# Patient Record
Sex: Female | Born: 1965 | Race: Black or African American | Hispanic: No | Marital: Married | Smoking: Never smoker
Health system: Southern US, Community
[De-identification: ages and names within clinical notes are randomized; demographics above are authoritative.]

## PROBLEM LIST (undated history)

## (undated) DIAGNOSIS — N920 Excessive and frequent menstruation with regular cycle: Secondary | ICD-10-CM

## (undated) DIAGNOSIS — Z87898 Personal history of other specified conditions: Secondary | ICD-10-CM

## (undated) DIAGNOSIS — D259 Leiomyoma of uterus, unspecified: Secondary | ICD-10-CM

## (undated) DIAGNOSIS — I341 Nonrheumatic mitral (valve) prolapse: Secondary | ICD-10-CM

## (undated) DIAGNOSIS — D500 Iron deficiency anemia secondary to blood loss (chronic): Secondary | ICD-10-CM

## (undated) HISTORY — DX: Nonrheumatic mitral (valve) prolapse: I34.1

## (undated) HISTORY — PX: WISDOM TOOTH EXTRACTION: SHX21

## (undated) HISTORY — PX: DILATION AND CURETTAGE OF UTERUS: SHX78

---

## 2000-09-30 ENCOUNTER — Other Ambulatory Visit: Admission: RE | Admit: 2000-09-30 | Discharge: 2000-09-30 | Payer: Self-pay | Admitting: Gynecology

## 2000-12-18 ENCOUNTER — Other Ambulatory Visit: Admission: RE | Admit: 2000-12-18 | Discharge: 2000-12-18 | Payer: Self-pay | Admitting: Gynecology

## 2001-06-26 ENCOUNTER — Other Ambulatory Visit: Admission: RE | Admit: 2001-06-26 | Discharge: 2001-06-26 | Payer: Self-pay | Admitting: Gynecology

## 2008-06-09 ENCOUNTER — Encounter: Payer: Self-pay | Admitting: Women's Health

## 2008-06-09 ENCOUNTER — Other Ambulatory Visit: Admission: RE | Admit: 2008-06-09 | Discharge: 2008-06-09 | Payer: Self-pay | Admitting: Gynecology

## 2008-06-09 ENCOUNTER — Ambulatory Visit: Payer: Self-pay | Admitting: Women's Health

## 2008-07-13 ENCOUNTER — Ambulatory Visit: Payer: Self-pay | Admitting: Women's Health

## 2008-11-03 ENCOUNTER — Emergency Department (HOSPITAL_COMMUNITY): Admission: EM | Admit: 2008-11-03 | Discharge: 2008-11-04 | Payer: Self-pay | Admitting: Emergency Medicine

## 2009-12-29 ENCOUNTER — Ambulatory Visit: Payer: Self-pay | Admitting: Women's Health

## 2009-12-29 ENCOUNTER — Other Ambulatory Visit: Admission: RE | Admit: 2009-12-29 | Discharge: 2009-12-29 | Payer: Self-pay | Admitting: Gynecology

## 2010-02-18 ENCOUNTER — Inpatient Hospital Stay (HOSPITAL_COMMUNITY): Admission: AD | Admit: 2010-02-18 | Discharge: 2010-02-18 | Payer: Self-pay | Admitting: Obstetrics and Gynecology

## 2010-02-20 ENCOUNTER — Ambulatory Visit (HOSPITAL_COMMUNITY)
Admission: AD | Admit: 2010-02-20 | Discharge: 2010-02-21 | Payer: Self-pay | Source: Home / Self Care | Admitting: Obstetrics and Gynecology

## 2010-02-20 ENCOUNTER — Encounter (INDEPENDENT_AMBULATORY_CARE_PROVIDER_SITE_OTHER): Payer: Self-pay | Admitting: Obstetrics and Gynecology

## 2010-03-25 DEATH — deceased

## 2010-06-06 LAB — BASIC METABOLIC PANEL
CO2: 26 mEq/L (ref 19–32)
Calcium: 8.5 mg/dL (ref 8.4–10.5)
Creatinine, Ser: 0.85 mg/dL (ref 0.4–1.2)
GFR calc Af Amer: >60 mL/min (ref >60)
Glucose, Bld: 125 mg/dL — ABNORMAL HIGH (ref 70–99)

## 2010-06-06 LAB — CBC
HCT: 27.1 % — ABNORMAL LOW (ref 36.0–46.0)
Hemoglobin: 7.6 g/dL — ABNORMAL LOW (ref 12.0–15.0)
MCH: 21.1 pg — ABNORMAL LOW (ref 26.0–34.0)
MCH: 21.2 pg — ABNORMAL LOW (ref 26.0–34.0)
MCH: 21.8 pg — ABNORMAL LOW (ref 26.0–34.0)
MCHC: 30.7 g/dL (ref 30.0–36.0)
MCHC: 31 g/dL (ref 30.0–36.0)
MCHC: 31.7 g/dL (ref 30.0–36.0)
MCV: 68.5 fL — ABNORMAL LOW (ref 78.0–100.0)
Platelets: 216 10*3/uL (ref 150–400)
RBC: 3.12 MIL/uL — ABNORMAL LOW (ref 3.87–5.11)
RDW: 24.9 % — ABNORMAL HIGH (ref 11.5–15.5)
RDW: 24.9 % — ABNORMAL HIGH (ref 11.5–15.5)
WBC: 13.1 10*3/uL — ABNORMAL HIGH (ref 4.0–10.5)

## 2010-06-30 LAB — URINALYSIS, ROUTINE W REFLEX MICROSCOPIC
Glucose, UA: NEGATIVE mg/dL
Specific Gravity, Urine: 1.023 (ref 1.005–1.030)

## 2010-06-30 LAB — WET PREP, GENITAL: Yeast Wet Prep HPF POC: NONE SEEN

## 2010-06-30 LAB — URINE MICROSCOPIC-ADD ON

## 2011-01-03 ENCOUNTER — Encounter: Payer: Self-pay | Admitting: Anesthesiology

## 2011-01-09 ENCOUNTER — Other Ambulatory Visit (HOSPITAL_COMMUNITY)
Admission: RE | Admit: 2011-01-09 | Discharge: 2011-01-09 | Disposition: A | Discharge status: Home / Self Care | Payer: PRIVATE HEALTH INSURANCE | Source: Ambulatory Visit | Attending: Women's Health | Admitting: Women's Health

## 2011-01-09 ENCOUNTER — Encounter: Payer: Self-pay | Admitting: Women's Health

## 2011-01-09 ENCOUNTER — Ambulatory Visit (INDEPENDENT_AMBULATORY_CARE_PROVIDER_SITE_OTHER): Payer: PRIVATE HEALTH INSURANCE | Admitting: Women's Health

## 2011-01-09 VITALS — BP 124/72 | Ht 67.0 in | Wt 208.0 lb

## 2011-01-09 DIAGNOSIS — R82998 Other abnormal findings in urine: Secondary | ICD-10-CM

## 2011-01-09 DIAGNOSIS — Z01419 Encounter for gynecological examination (general) (routine) without abnormal findings: Secondary | ICD-10-CM | POA: Insufficient documentation

## 2011-01-09 NOTE — Progress Notes (Signed)
Pamela Nguyen 1965-09-08 [id number]    History:    The patient presents for annual exam. 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 Daughters ages 80 and 43.   Past medical history, past surgical history, family history and social history were all reviewed and documented in the EPIC chart.   ROS:  A  ROS was performed and pertinent positives and negatives are included in the history.  Exam:  Filed Vitals:   01/09/11 1434  BP: 46/72    General appearance:  Normal Head/Neck:  Normal, without cervical or supraclavicular adenopathy. Thyroid:  Symmetrical, normal in size, without palpable masses or nodularity. Respiratory  Effort:  Normal  Auscultation:  Clear without wheezing or rhonchi Cardiovascular  Auscultation:  Regular rate, without rubs, murmurs or gallops  Edema/varicosities:  Not grossly evident Abdominal  Soft,nontender, without masses, guarding or rebound.  Liver/spleen:  No organomegaly noted  Hernia:  None appreciated  Skin  Inspection:  Grossly normal  Palpation:  Grossly normal Neurologic/psychiatric  Orientation:  Normal with appropriate conversation.  Mood/affect:  Normal  Genitourinary    Breasts: Examined lying and sitting.     Right: Without masses, retractions, discharge or axillary adenopathy.     Left: Without masses, retractions, discharge or axillary adenopathy.   Inguinal/mons:  Normal without inguinal adenopathy  External genitalia:  Normal  BUS/Urethra/Skene's glands:  Normal  Bladder:  Normal  Vagina:  Normal  Cervix:  Normal  Uterus:   normal in size, shape and contour.  Midline and mobile  Adnexa/parametria:     Rt: Without masses or tenderness.   Lt: Without masses or tenderness.  Anus and perineum: Normal  Digital rectal exam: Normal sphincter tone without palpated masses or tenderness  Assessment/Plan:  45 y.o.MBF G3P2  for annual exam monthly 7 day cycle using no contraception. Contraception options were  reviewed and declined states that if pregnancy occurs it will be fine. Is currently taking a prenatal vitamin daily. She has never had a mammogram, strongly encouraged to schedule. History of iron deficiency anemia.  Normal GYN exam with anemia  Plan: Did review fertility,  increased risk for chromosomal problems and decreased fertility as we age. Will continue with her prenatal vitamin daily, increase iron rich foods. Reviewed lysteda  with cycles. Sample, information was given and reviewed slight risks for blood clots, will contemplate using, states she doesn't like to use medications. SBEs, breast center number was given and strongly encouraged a screening mammogram. Encouraged exercise, decreasing calories for weight loss. CBC, UA and Pap. Had a normal lipid screen last year.   Harrington Challenger [facility], 3:28 PM 01/09/2011

## 2011-01-14 ENCOUNTER — Telehealth: Payer: Self-pay | Admitting: Women's Health

## 2011-01-14 NOTE — Telephone Encounter (Signed)
Telephone call, reviewed hemoglobin/ hematocrit are low 8.1 and 26.3. Has had a hemoglobin that was 6.8 about a year ago, had been on iron supplements but then did stop. Will start back on an iron supplement twice daily, continue a multivitamin daily. Is aware of iron rich foods and will try to increase daily. Will recheck a CBC in December. If it is not elevated we'll then do iron studies. Denies sickle cell trait. Works at [employer], will have a CBC checked they are and fax to our office.

## 2011-08-26 ENCOUNTER — Other Ambulatory Visit: Payer: Self-pay | Admitting: Obstetrics and Gynecology

## 2011-08-26 DIAGNOSIS — R635 Abnormal weight gain: Secondary | ICD-10-CM

## 2011-08-26 DIAGNOSIS — N920 Excessive and frequent menstruation with regular cycle: Secondary | ICD-10-CM | POA: Insufficient documentation

## 2011-08-26 DIAGNOSIS — D649 Anemia, unspecified: Secondary | ICD-10-CM | POA: Insufficient documentation

## 2011-08-26 DIAGNOSIS — L659 Nonscarring hair loss, unspecified: Secondary | ICD-10-CM

## 2011-08-26 NOTE — Progress Notes (Signed)
Patient with menorrhagia (5 day flow, 3 days changes tampon and pad hourly-no cramps) also complains of thinning hair and problems losing weight for the past 2 months.  Requesting labs for evaluation. CBC, Thyroid Panel & Ferritin are pending.  Loryn Haacke, PA-C

## 2011-08-27 ENCOUNTER — Other Ambulatory Visit: Payer: PRIVATE HEALTH INSURANCE

## 2011-08-27 DIAGNOSIS — N920 Excessive and frequent menstruation with regular cycle: Secondary | ICD-10-CM

## 2011-08-27 DIAGNOSIS — L659 Nonscarring hair loss, unspecified: Secondary | ICD-10-CM

## 2011-08-27 DIAGNOSIS — D649 Anemia, unspecified: Secondary | ICD-10-CM

## 2011-08-27 DIAGNOSIS — R635 Abnormal weight gain: Secondary | ICD-10-CM

## 2011-08-27 LAB — TSH: TSH: 1.923 u[IU]/mL (ref 0.350–4.500)

## 2011-08-27 LAB — CBC
MCH: 22.1 pg — ABNORMAL LOW (ref 26.0–34.0)
MCHC: 30.7 g/dL (ref 30.0–36.0)
Platelets: 344 10*3/uL (ref 150–400)

## 2011-08-30 ENCOUNTER — Telehealth: Payer: Self-pay | Admitting: Obstetrics and Gynecology

## 2011-08-30 NOTE — Telephone Encounter (Signed)
46 YO with  history of anemia had labs done recently due to increased fatigue and thinning hair.  Notified of normal thyroid test but low hemoglobin and though low normal ferritin, it was too low to support her hair growth.  Advised OTC iron twice daily x 6 weeks with a repeat cbc at that time.  Patient was agreeable.  Briarrose Shor, PA-C

## 2011-09-24 ENCOUNTER — Other Ambulatory Visit: Payer: Self-pay | Admitting: Obstetrics and Gynecology

## 2011-09-24 ENCOUNTER — Other Ambulatory Visit: Payer: Self-pay | Admitting: Women's Health

## 2011-09-24 DIAGNOSIS — Z1231 Encounter for screening mammogram for malignant neoplasm of breast: Secondary | ICD-10-CM

## 2011-09-27 ENCOUNTER — Ambulatory Visit
Admission: RE | Admit: 2011-09-27 | Discharge: 2011-09-27 | Disposition: A | Discharge status: Home / Self Care | Payer: PRIVATE HEALTH INSURANCE | Source: Ambulatory Visit | Attending: Obstetrics and Gynecology | Admitting: Obstetrics and Gynecology

## 2011-09-27 DIAGNOSIS — Z1231 Encounter for screening mammogram for malignant neoplasm of breast: Secondary | ICD-10-CM

## 2014-01-24 ENCOUNTER — Encounter: Payer: Self-pay | Admitting: Women's Health

## 2016-12-27 ENCOUNTER — Encounter (HOSPITAL_COMMUNITY): Payer: Self-pay | Admitting: Emergency Medicine

## 2016-12-27 DIAGNOSIS — D649 Anemia, unspecified: Secondary | ICD-10-CM | POA: Diagnosis not present

## 2016-12-27 DIAGNOSIS — I10 Essential (primary) hypertension: Secondary | ICD-10-CM | POA: Insufficient documentation

## 2016-12-27 DIAGNOSIS — Z5329 Procedure and treatment not carried out because of patient's decision for other reasons: Secondary | ICD-10-CM | POA: Insufficient documentation

## 2016-12-27 DIAGNOSIS — R55 Syncope and collapse: Secondary | ICD-10-CM | POA: Diagnosis not present

## 2016-12-27 LAB — I-STAT BETA HCG BLOOD, ED (MC, WL, AP ONLY)

## 2016-12-27 MED ORDER — ONDANSETRON 4 MG PO TBDP: 4.0000 mg | ORAL_TABLET | Freq: Once | ORAL | Status: DC | PRN

## 2016-12-27 NOTE — ED Triage Notes (Signed)
Patient was standing at a football game around 8 pm. Patient husband states he saw his wife sweating and then she passed out. Patient husband states the was caught before she hit the ground. Patient states she has had several times where she feels like she is going to pass out but usually sit down and rest. She states it has not never been this bad. Patient does not remember passing out or sweating. She states she felt weak and dizzy.

## 2016-12-28 ENCOUNTER — Emergency Department (HOSPITAL_COMMUNITY)
Admission: EM | Admit: 2016-12-28 | Discharge: 2016-12-28 | Disposition: A | Discharge status: Home / Self Care | Payer: 59 | Attending: Emergency Medicine | Admitting: Emergency Medicine

## 2016-12-28 DIAGNOSIS — Z87898 Personal history of other specified conditions: Secondary | ICD-10-CM

## 2016-12-28 DIAGNOSIS — D649 Anemia, unspecified: Secondary | ICD-10-CM

## 2016-12-28 DIAGNOSIS — R55 Syncope and collapse: Secondary | ICD-10-CM

## 2016-12-28 HISTORY — DX: Personal history of other specified conditions: Z87.898

## 2016-12-28 LAB — CBC
HCT: 16.5 % — ABNORMAL LOW (ref 36.0–46.0)
HEMOGLOBIN: 4 g/dL — AB (ref 12.0–15.0)
MCH: 13.8 pg — ABNORMAL LOW (ref 26.0–34.0)
MCHC: 24.2 g/dL — AB (ref 30.0–36.0)
MCV: 57.1 fL — ABNORMAL LOW (ref 78.0–100.0)
Platelets: 371 10*3/uL (ref 150–400)
RBC: 2.89 MIL/uL — ABNORMAL LOW (ref 3.87–5.11)
RDW: 19.7 % — ABNORMAL HIGH (ref 11.5–15.5)
WBC: 9.8 10*3/uL (ref 4.0–10.5)

## 2016-12-28 LAB — ABO/RH: ABO/RH(D): A POS

## 2016-12-28 LAB — BASIC METABOLIC PANEL
ANION GAP: 9 (ref 5–15)
BUN: 10 mg/dL (ref 6–20)
CALCIUM: 9.2 mg/dL (ref 8.9–10.3)
CO2: 25 mmol/L (ref 22–32)
Chloride: 102 mmol/L (ref 101–111)
Creatinine, Ser: 0.76 mg/dL (ref 0.44–1.00)
GFR calc Af Amer: >60 mL/min (ref >60)
GLUCOSE: 138 mg/dL — AB (ref 65–99)
Potassium: 4.1 mmol/L (ref 3.5–5.1)
SODIUM: 136 mmol/L (ref 135–145)

## 2016-12-28 LAB — LIPASE, BLOOD: Lipase: 21 U/L (ref 11–51)

## 2016-12-28 LAB — CBG MONITORING, ED: GLUCOSE-CAPILLARY: 153 mg/dL — AB (ref 65–99)

## 2016-12-28 LAB — PREPARE RBC (CROSSMATCH)

## 2016-12-28 MED ORDER — SODIUM CHLORIDE 0.9 % IV SOLN: Freq: Once | INTRAVENOUS | Status: DC

## 2016-12-28 NOTE — ED Provider Notes (Signed)
[facility] DEPT Provider Note   CSN: [id number] Arrival date & time: 12/27/16  2310     History   Chief Complaint Chief Complaint  Patient presents with  . Loss of Consciousness    HPI Pamela Nguyen is a 51 y.o. female with history of MVP, HTN, menorrhagia, and chronic anemia who presents today with chief complaint acute onset, resolved syncopal episode. Patient states that she was at a football game when at around 8 PM she felt very lightheaded and experienced a pressure sensation in her head. She states this is how she usually feels when she is about to pass out, so she attempted to look for a place to sit, but states "and then everything went black before I could find somewhere to sit ". Patient's husband and a bystander were able to catch her before she fell and lowered her to the ground. Patient's husband states that she was unresponsive for ~ 3 minutes. Patient states that this feels like the last time that she was severely anemic and required blood transfusion 6 years ago after a miscarriage. She states that she is currently on her menstrual cycle, which she has been on for 7 days. She states that lately her cycles have been lasting upwards of 2 weeks but have recently been normalizing. She states that she has always had very heavy periods. She states that for the past week she has been experiencing lightheadedness especially with position changes, increasing shortness of breath with activity, and fatigue "feeling like my legs will give out after walking for a while". States that she has not had her anemia evaluated in several years as she has been without a PCP for approximately 3 years. States that her hemoglobin typically runs around 9.  The history is provided by the spouse and the patient.    Past Medical History:  Diagnosis Date  . Hypertension   . MVP (mitral valve prolapse)     Patient Active Problem List   Diagnosis Date Noted  . Weight gain 08/26/2011  .  Hair loss 08/26/2011  . Menorrhagia 08/26/2011  . Anemia 08/26/2011    Past Surgical History:  Procedure Laterality Date  . CESAREAN SECTION  04/01/1986   girl  . DILATION AND CURETTAGE OF UTERUS  nov. 2011    OB History    Gravida Para Term Preterm AB Living   3 2     1 2    SAB TAB Ectopic Multiple Live Births   1               Home Medications    Prior to Admission medications   Not on File    Family History Family History  Problem Relation Age of Onset  . Hypertension Mother   . Breast cancer Maternal Aunt   . Diabetes Maternal Grandmother   . Hypertension Maternal Grandmother   . Diabetes Maternal Grandfather   . Hypertension Maternal Grandfather   . Heart disease Maternal Grandfather     Social History Social History  Substance Use Topics  . Smoking status: Never Smoker  . Smokeless tobacco: Never Used  . Alcohol use Yes     Comment: social     Allergies   Patient has no known allergies.   Review of Systems Review of Systems  Constitutional: Positive for fatigue. Negative for chills and fever.  Respiratory: Positive for shortness of breath.   Cardiovascular: Negative for chest pain.  Gastrointestinal: Negative for abdominal pain, nausea and vomiting.  Genitourinary: Positive  for menstrual problem.  Musculoskeletal: Negative for back pain and neck pain.  Neurological: Positive for syncope and light-headedness. Negative for numbness and headaches.  All other systems reviewed and are negative.    Physical Exam Updated Vital Signs BP (!) 143/56 (BP Location: Left Arm)   Pulse 78   Temp 98.1 F (36.7 C) (Oral)   Resp 20   Ht 5\' 7"  (1.702 m)   Wt 86.2 kg (190 lb)   SpO2 100%   BMI 29.76 kg/m   Physical Exam  Constitutional: She is oriented to person, place, and time. She appears well-developed and well-nourished. No distress.  HENT:  Head: Normocephalic and atraumatic.  Right Ear: External ear normal.  Left Ear: External ear normal.    No Battle's signs, no raccoon's eyes, no rhinorrhea. No hemotympanum. No tenderness to palpation of the face or skull. No deformity, crepitus, or swelling noted.   Eyes: Pupils are equal, round, and reactive to light. Conjunctivae and EOM are normal. Right eye exhibits no discharge. Left eye exhibits no discharge.  Palpebral conjunctiva are pale  Neck: Normal range of motion. Neck supple. No JVD present. No tracheal deviation present.  Cardiovascular:  Tachycardic, 2+ radial and DP/PT pulses bl, negative Homan's bl   Pulmonary/Chest: Effort normal and breath sounds normal. She exhibits no tenderness.  Abdominal: Soft. She exhibits no distension. There is no tenderness.  Musculoskeletal: Normal range of motion. She exhibits no edema or tenderness.  No midline spine TTP, no paraspinal muscle tenderness, no deformity, crepitus, or step-off noted   Neurological: She is alert and oriented to person, place, and time. No cranial nerve deficit or sensory deficit. She exhibits normal muscle tone.  Skin: Skin is warm and dry. Capillary refill takes less than 2 seconds. No erythema. There is pallor.  Psychiatric: She has a normal mood and affect. Her behavior is normal.  Nursing note and vitals reviewed.    ED Treatments / Results  Labs (all labs ordered are listed, but only abnormal results are displayed) Labs Reviewed  BASIC METABOLIC PANEL - Abnormal; Notable for the following:       Result Value   Glucose, Bld 138 (*)    All other components within normal limits  CBC - Abnormal; Notable for the following:    RBC 2.89 (*)    Hemoglobin 4.0 (*)    HCT 16.5 (*)    MCV 57.1 (*)    MCH 13.8 (*)    MCHC 24.2 (*)    RDW 19.7 (*)    All other components within normal limits  CBG MONITORING, ED - Abnormal; Notable for the following:    Glucose-Capillary 153 (*)    All other components within normal limits  LIPASE, BLOOD  URINALYSIS, ROUTINE W REFLEX MICROSCOPIC  I-STAT BETA HCG BLOOD, ED  (MC, [facility], AP ONLY)  TYPE AND SCREEN  PREPARE RBC (CROSSMATCH)  ABO/RH    EKG  EKG Interpretation  Date/Time:  Friday December 27 2016 23:33:21 EDT Ventricular Rate:  78 PR Interval:    QRS Duration: 88 QT Interval:  397 QTC Calculation: 453 R Axis:   71 Text Interpretation:  Sinus rhythm Consider left ventricular hypertrophy Borderline T abnormalities, anterior leads No old tracing to compare Confirmed by Sherwood Gambler [phone]) on 12/27/2016 11:39:10 PM       Radiology No results found.  Procedures Procedures (including critical care time)  Medications Ordered in ED Medications  ondansetron (ZOFRAN-ODT) disintegrating tablet 4 mg (not administered)  0.9 %  sodium chloride infusion (not administered)     Initial Impression / Assessment and Plan / ED Course  I have reviewed the triage vital signs and the nursing notes.  Pertinent labs & imaging results that were available during my care of the patient were reviewed by me and considered in my medical decision making (see chart for details).     Patient with history of anemia presents after syncopal episode. Found to have hemoglobin of 4. Afebrile, tachycardic on my examination but otherwise vital signs are stable. No focal neurological deficits on examination and she denies headache or head injury. I doubt ICH, skull fracture, or other intracranial abnormality. Obtained type and screen and will plan to transfuse. Spoke with Dr. Alcario Drought with [facility] who agrees to assume care of pt and bring her into the hospital for further management. patient seen and evaluated by Dr. Verner Chol who agrees with assessment and plan at this time.   2:54 AM Patient wants to leave against medical advice. Patient understands that her actions will lead to inadequate medical workup, and that she is at risk of complications of missed diagnosis, which includes morbidity and mortality. Dr. Regenia Skeeter and Dr. Alcario Drought alternative options and patient was given  the opportunity to change her mind.discussed. Patient is demonstrating good capacity to make decision. Patient understands that she needs to return to the ED immediately if any concerning signs or symptoms develop or worsen.   Final diagnoses:  Symptomatic anemia  Syncope and collapse    New Prescriptions New Prescriptions   No medications on file     Debroah Baller 12/28/16 [place], [place], MD 12/28/16 [phone]

## 2016-12-28 NOTE — ED Notes (Addendum)
Date and time results received: 12/28/16  0101 Test: Hemoglobin Critical Value: 4.0  Name of Provider Notified: Regenia Skeeter  Orders Received? Or Actions Taken?: MD will follow up with patient regarding plan of care

## 2016-12-28 NOTE — Discharge Instructions (Signed)
Follow-up with a primary care physician for reevaluation of your anemia as soon as possible. Return to the ED immediately if any concerning signs or symptoms develop such as repeat loss of consciousness, blood in your urine or stool, or worsening bleeding.

## 2016-12-28 NOTE — ED Notes (Signed)
Pt aware of low hgb and suggestions of blood transfusion made by MD. Pt refusing to get blood transfusion and prefers to go home. Pt states she has chronic anemia along with family history of such. Pt leaving AMA.

## 2017-01-01 LAB — TYPE AND SCREEN
ABO/RH(D): A POS
Antibody Screen: NEGATIVE
UNIT DIVISION: 0
Unit division: 0

## 2017-01-01 LAB — BPAM RBC
BLOOD PRODUCT EXPIRATION DATE: 201810182359
BLOOD PRODUCT EXPIRATION DATE: 201810182359
Unit Type and Rh: 6200
Unit Type and Rh: 6200

## 2017-04-15 ENCOUNTER — Encounter (HOSPITAL_COMMUNITY): Payer: Self-pay | Admitting: Obstetrics and Gynecology

## 2017-05-28 ENCOUNTER — Encounter (HOSPITAL_COMMUNITY): Payer: Self-pay | Admitting: *Deleted

## 2017-05-29 ENCOUNTER — Other Ambulatory Visit: Payer: Self-pay | Admitting: Obstetrics and Gynecology

## 2017-06-18 ENCOUNTER — Inpatient Hospital Stay (HOSPITAL_COMMUNITY): Admission: RE | Admit: 2017-06-18 | Payer: PRIVATE HEALTH INSURANCE | Source: Ambulatory Visit

## 2017-07-03 ENCOUNTER — Encounter (HOSPITAL_BASED_OUTPATIENT_CLINIC_OR_DEPARTMENT_OTHER): Payer: Self-pay | Admitting: *Deleted

## 2017-07-07 ENCOUNTER — Encounter (HOSPITAL_BASED_OUTPATIENT_CLINIC_OR_DEPARTMENT_OTHER): Payer: Self-pay | Admitting: *Deleted

## 2017-07-07 ENCOUNTER — Other Ambulatory Visit: Payer: Self-pay

## 2017-07-07 NOTE — Progress Notes (Signed)
SPOKE W/ PT VIA PHONE FOR PRE-OP INTERVIEW.  NPO AFTER MN.  ARRIVE AT 0630.  GETTING CBC, BMET, T&S DONE Thursday [id number] @1300 .

## 2017-07-10 ENCOUNTER — Encounter (HOSPITAL_COMMUNITY)
Admission: RE | Admit: 2017-07-10 | Discharge: 2017-07-10 | Disposition: A | Discharge status: Home / Self Care | Payer: 59 | Source: Ambulatory Visit | Attending: Obstetrics and Gynecology | Admitting: Obstetrics and Gynecology

## 2017-07-10 DIAGNOSIS — Z01812 Encounter for preprocedural laboratory examination: Secondary | ICD-10-CM | POA: Insufficient documentation

## 2017-07-10 LAB — BASIC METABOLIC PANEL
Anion gap: 11 (ref 5–15)
BUN: 13 mg/dL (ref 6–20)
CALCIUM: 9.6 mg/dL (ref 8.9–10.3)
CHLORIDE: 108 mmol/L (ref 101–111)
CO2: 27 mmol/L (ref 22–32)
CREATININE: 0.79 mg/dL (ref 0.44–1.00)
GFR calc non Af Amer: >60 mL/min (ref >60)
Glucose, Bld: 101 mg/dL — ABNORMAL HIGH (ref 65–99)
Potassium: 5 mmol/L (ref 3.5–5.1)
SODIUM: 146 mmol/L — AB (ref 135–145)

## 2017-07-10 LAB — CBC
HCT: 34.7 % — ABNORMAL LOW (ref 36.0–46.0)
Hemoglobin: 10.3 g/dL — ABNORMAL LOW (ref 12.0–15.0)
MCH: 24.3 pg — ABNORMAL LOW (ref 26.0–34.0)
MCHC: 29.7 g/dL — ABNORMAL LOW (ref 30.0–36.0)
MCV: 81.8 fL (ref 78.0–100.0)
PLATELETS: 330 10*3/uL (ref 150–400)
RBC: 4.24 MIL/uL (ref 3.87–5.11)
RDW: 14.5 % (ref 11.5–15.5)
WBC: 6.9 10*3/uL (ref 4.0–10.5)

## 2017-07-15 NOTE — H&P (Signed)
Admission History and Physical Exam for a Gynecology Patient  [name] is a 52 y.o. female, [id number], who presents for hysteroscopy, D and C, and resection of a submucosal fibroid. She has been followed at the [facility]cology division of [facility]. She C/O menorrhagia. She has anemia. An US showed a multifibroid uterus with a submucosal fibroid measuring 3.31 cm. She does not want hysterectomy or ablation.  OB History    Gravida  3   Para  2   Term      Preterm      AB  1   Living  2     SAB  1   TAB      Ectopic      Multiple      Live Births              Past Medical History:  Diagnosis Date  . Chronic blood loss anemia    in epic ED visit 12-28-2016 syncope w/ collapse, Hg 4.0 , refused blood products and left AMA  . History of syncope 12/28/2016   w/ collapse--- ED visit dx severe anemia Hg 4.0,  pt refused blood transfusion and left AMA;  in epic documented prior Hg 6.8 after miscarriage 02-20-2010  . Menorrhagia   . MVP (mitral valve prolapse)    07-07-2017  per pt dx w/ MVP at age 52, had echo was told very mild, pt asymptomatic--- no further has been done  . Uterine fibroid     Meds: Vit D, Vit C, and Iron  Past Surgical History:  Procedure Laterality Date  . CESAREAN SECTION  04/01/1986  . DILATION AND CURETTAGE OF UTERUS  02-20-2010  dr Raphael Gibney   w/ sunction  for missed ab  . WISDOM TOOTH EXTRACTION      Allergies  Allergen Reactions  . Codeine     "cough medication with codeine, causes me cough to worsen"    Family History: family history includes Breast cancer in her maternal aunt; Diabetes in her maternal grandfather and maternal grandmother; Heart disease in her maternal grandfather; Hypertension in her maternal grandfather, maternal grandmother, and mother.  Social History:  reports that she has never smoked. She has never used smokeless tobacco. She reports that she drank  alcohol. She reports that she does not use drugs.  Review of systems: See HPI.  Admission Physical Exam:    Body mass index is 29.76 kg/m.  Height 5\' 7"  (1.702 m), weight 86.2 kg (190 lb), last menstrual period 06/06/2017.  HEENT:                 Within normal limits Chest:                   Clear Heart:                    Regular rate and rhythm Breasts:                No masses, skin changes, bleeding, or discharge present Abdomen:             Nontender, no masses Extremities:          Grossly normal Neurologic exam: Grossly normal  Pelvic exam:  External genitalia: normal general appearance Vaginal: normal without tenderness, induration or masses Cervix: normal appearance Adnexa: normal bimanual exam Uterus: 12 weeks size with multiple fibroids.  Assessment:  Menorrhagia  Fibroid Uterus  Anemia  BMI  is 30.5  Plan:  Hysteroscopy, D and C, Resection of submucosal fibroid.   Eli Hose 07/15/2017

## 2017-07-16 ENCOUNTER — Ambulatory Visit (HOSPITAL_BASED_OUTPATIENT_CLINIC_OR_DEPARTMENT_OTHER): Payer: 59 | Admitting: Anesthesiology

## 2017-07-16 ENCOUNTER — Other Ambulatory Visit: Payer: Self-pay

## 2017-07-16 ENCOUNTER — Encounter (HOSPITAL_BASED_OUTPATIENT_CLINIC_OR_DEPARTMENT_OTHER): Payer: Self-pay | Admitting: Anesthesiology

## 2017-07-16 ENCOUNTER — Encounter (HOSPITAL_BASED_OUTPATIENT_CLINIC_OR_DEPARTMENT_OTHER)
Admission: RE | Disposition: A | Discharge status: Home / Self Care | Payer: Self-pay | Source: Ambulatory Visit | Attending: Obstetrics and Gynecology

## 2017-07-16 ENCOUNTER — Ambulatory Visit (HOSPITAL_BASED_OUTPATIENT_CLINIC_OR_DEPARTMENT_OTHER)
Admission: RE | Admit: 2017-07-16 | Discharge: 2017-07-16 | Disposition: A | Discharge status: Home / Self Care | Payer: 59 | Source: Ambulatory Visit | Attending: Obstetrics and Gynecology | Admitting: Obstetrics and Gynecology

## 2017-07-16 DIAGNOSIS — D649 Anemia, unspecified: Secondary | ICD-10-CM | POA: Diagnosis not present

## 2017-07-16 DIAGNOSIS — D250 Submucous leiomyoma of uterus: Secondary | ICD-10-CM | POA: Diagnosis present

## 2017-07-16 DIAGNOSIS — N920 Excessive and frequent menstruation with regular cycle: Secondary | ICD-10-CM | POA: Insufficient documentation

## 2017-07-16 HISTORY — DX: Iron deficiency anemia secondary to blood loss (chronic): D50.0

## 2017-07-16 HISTORY — DX: Leiomyoma of uterus, unspecified: D25.9

## 2017-07-16 HISTORY — PX: DILATATION & CURRETTAGE/HYSTEROSCOPY WITH RESECTOCOPE: SHX5572

## 2017-07-16 HISTORY — DX: Excessive and frequent menstruation with regular cycle: N92.0

## 2017-07-16 HISTORY — DX: Personal history of other specified conditions: Z87.898

## 2017-07-16 LAB — TYPE AND SCREEN
ABO/RH(D): A POS
Antibody Screen: NEGATIVE

## 2017-07-16 LAB — POCT PREGNANCY, URINE: Preg Test, Ur: NEGATIVE

## 2017-07-16 SURGERY — DILATATION & CURETTAGE/HYSTEROSCOPY WITH RESECTOCOPE
Anesthesia: General | Site: Uterus

## 2017-07-16 MED ORDER — FENTANYL CITRATE (PF) 100 MCG/2ML IJ SOLN
INTRAMUSCULAR | Status: AC
  Filled 2017-07-16: qty 2

## 2017-07-16 MED ORDER — DEXAMETHASONE SODIUM PHOSPHATE 10 MG/ML IJ SOLN
INTRAMUSCULAR | Status: AC
  Filled 2017-07-16: qty 1

## 2017-07-16 MED ORDER — FENTANYL CITRATE (PF) 100 MCG/2ML IJ SOLN
INTRAMUSCULAR | Status: DC | PRN
  Administered 2017-07-16 (×2): 25 ug via INTRAVENOUS
  Administered 2017-07-16: 100 ug via INTRAVENOUS
  Administered 2017-07-16: 50 ug via INTRAVENOUS

## 2017-07-16 MED ORDER — PROPOFOL 10 MG/ML IV BOLUS
INTRAVENOUS | Status: AC
  Filled 2017-07-16: qty 40

## 2017-07-16 MED ORDER — PROMETHAZINE HCL 25 MG/ML IJ SOLN
6.2500 mg | INTRAMUSCULAR | Status: DC | PRN
  Filled 2017-07-16: qty 1

## 2017-07-16 MED ORDER — BUPIVACAINE-EPINEPHRINE 0.5% -1:200000 IJ SOLN
INTRAMUSCULAR | Status: DC | PRN
  Administered 2017-07-16: 10 mL

## 2017-07-16 MED ORDER — ONDANSETRON HCL 4 MG/2ML IJ SOLN
INTRAMUSCULAR | Status: AC
  Filled 2017-07-16: qty 2

## 2017-07-16 MED ORDER — PROPOFOL 10 MG/ML IV BOLUS
INTRAVENOUS | Status: DC | PRN
  Administered 2017-07-16: 200 mg via INTRAVENOUS

## 2017-07-16 MED ORDER — MIDAZOLAM HCL 2 MG/2ML IJ SOLN
INTRAMUSCULAR | Status: AC
  Filled 2017-07-16: qty 2

## 2017-07-16 MED ORDER — KETOROLAC TROMETHAMINE 30 MG/ML IJ SOLN
INTRAMUSCULAR | Status: DC | PRN
  Administered 2017-07-16: 30 mg via INTRAVENOUS

## 2017-07-16 MED ORDER — SODIUM CHLORIDE 0.9 % IR SOLN
Status: DC | PRN
  Administered 2017-07-16: 3000 mL

## 2017-07-16 MED ORDER — ONDANSETRON HCL 4 MG/2ML IJ SOLN
4.0000 mg | Freq: Once | INTRAMUSCULAR | Status: AC
  Administered 2017-07-16: 4 mg via INTRAVENOUS
  Filled 2017-07-16: qty 2

## 2017-07-16 MED ORDER — LACTATED RINGERS IV SOLN
INTRAVENOUS | Status: DC
  Administered 2017-07-16 (×2): via INTRAVENOUS
  Filled 2017-07-16: qty 1000

## 2017-07-16 MED ORDER — ACETAMINOPHEN 10 MG/ML IV SOLN
1000.0000 mg | Freq: Once | INTRAVENOUS | Status: DC | PRN
  Filled 2017-07-16: qty 100

## 2017-07-16 MED ORDER — ONDANSETRON HCL 4 MG/2ML IJ SOLN
INTRAMUSCULAR | Status: DC | PRN
  Administered 2017-07-16: 4 mg via INTRAVENOUS

## 2017-07-16 MED ORDER — LIDOCAINE 2% (20 MG/ML) 5 ML SYRINGE
INTRAMUSCULAR | Status: AC
  Filled 2017-07-16: qty 5

## 2017-07-16 MED ORDER — FENTANYL CITRATE (PF) 100 MCG/2ML IJ SOLN
25.0000 ug | INTRAMUSCULAR | Status: DC | PRN
  Filled 2017-07-16: qty 1

## 2017-07-16 MED ORDER — OXYCODONE-ACETAMINOPHEN 5-325 MG PO TABS
1.0000 | ORAL_TABLET | ORAL | 0 refills | Status: AC | PRN
Start: 2017-07-16 — End: ?

## 2017-07-16 MED ORDER — MIDAZOLAM HCL 5 MG/5ML IJ SOLN
INTRAMUSCULAR | Status: DC | PRN
  Administered 2017-07-16: 2 mg via INTRAVENOUS

## 2017-07-16 MED ORDER — IBUPROFEN 800 MG PO TABS: 800.0000 mg | ORAL_TABLET | Freq: Three times a day (TID) | ORAL | 1 refills | Status: AC | PRN

## 2017-07-16 MED ORDER — DEXAMETHASONE SODIUM PHOSPHATE 4 MG/ML IJ SOLN
INTRAMUSCULAR | Status: DC | PRN
  Administered 2017-07-16: 10 mg via INTRAVENOUS

## 2017-07-16 SURGICAL SUPPLY — 19 items
BIPOLAR CUTTING LOOP 21FR (ELECTRODE) ×2
CANISTER SUCT 3000ML PPV (MISCELLANEOUS) ×5 IMPLANT
CATH ROBINSON RED A/P 16FR (CATHETERS) ×3 IMPLANT
DEVICE MYOSURE REACH (MISCELLANEOUS) ×2 IMPLANT
DILATOR CANAL MILEX (MISCELLANEOUS) IMPLANT
ELECT REM PT RETURN 9FT ADLT (ELECTROSURGICAL) ×3
ELECTRODE REM PT RTRN 9FT ADLT (ELECTROSURGICAL) IMPLANT
GLOVE BIOGEL PI IND STRL 8.5 (GLOVE) ×1 IMPLANT
GLOVE BIOGEL PI INDICATOR 8.5 (GLOVE) ×2
GLOVE ECLIPSE 8.0 STRL XLNG CF (GLOVE) ×6 IMPLANT
GOWN STRL REUS W/ TWL XL LVL3 (GOWN DISPOSABLE) ×2 IMPLANT
GOWN STRL REUS W/TWL XL LVL3 (GOWN DISPOSABLE) ×6
KIT TURNOVER CYSTO (KITS) ×3 IMPLANT
LOOP CUTTING BIPOLAR 21FR (ELECTRODE) IMPLANT
MANIFOLD NEPTUNE II (INSTRUMENTS) IMPLANT
PACK ABDOMINAL GYN (CUSTOM PROCEDURE TRAY) ×3 IMPLANT
PAD OB MATERNITY 4.3X12.25 (PERSONAL CARE ITEMS) ×3 IMPLANT
TOWEL OR 17X24 6PK STRL BLUE (TOWEL DISPOSABLE) ×6 IMPLANT
WATER STERILE IRR 500ML POUR (IV SOLUTION) ×3 IMPLANT

## 2017-07-16 NOTE — Anesthesia Procedure Notes (Signed)
Procedure Name: LMA Insertion Date/Time: 07/16/2017 8:42 AM Performed by: Maryella Shivers, CRNA Pre-anesthesia Checklist: Patient identified, Emergency Drugs available, Suction available and Patient being monitored Patient Re-evaluated:Patient Re-evaluated prior to induction Oxygen Delivery Method: Circle system utilized Preoxygenation: Pre-oxygenation with 100% oxygen Induction Type: IV induction Ventilation: Mask ventilation without difficulty LMA: LMA inserted LMA Size: 4.0 Number of attempts: 1 Airway Equipment and Method: Bite block Placement Confirmation: positive ETCO2 Tube secured with: Tape Dental Injury: Teeth and Oropharynx as per pre-operative assessment

## 2017-07-16 NOTE — Progress Notes (Signed)
The patient was interviewed and examined today.  The previously documented history and physical examination was reviewed. There are no changes. The operative procedure was reviewed. The risks and benefits were outlined again. The specific risks include, but are not limited to, anesthetic complications, bleeding, infections, and possible damage to the surrounding organs. The patient's questions were answered.  We are ready to proceed as outlined. The likelihood of the patient achieving the goals of this procedure is very likely.   BP (!) 201/98   Pulse 73   Temp 98.8 F (37.1 C) (Oral)   Resp 16   Ht 5\' 7"  (1.702 m)   Wt 90 kg (198 lb 8 oz)   LMP 06/06/2017   SpO2 100%   BMI 31.09 kg/m  CBC    Component Value Date/Time   WBC 6.9 07/10/2017 1338   RBC 4.24 07/10/2017 1338   HGB 10.3 (L) 07/10/2017 1338   HCT 34.7 (L) 07/10/2017 1338   PLT 330 07/10/2017 1338   MCV 81.8 07/10/2017 1338   MCH 24.3 (L) 07/10/2017 1338   MCHC 29.7 (L) 07/10/2017 1338   RDW 14.5 07/10/2017 1338     Gildardo Cranker, M.D.

## 2017-07-16 NOTE — Anesthesia Postprocedure Evaluation (Signed)
Anesthesia Post Note  Patient: Pamela Nguyen  Procedure(s) Performed: DILATATION & CURETTAGE/HYSTEROSCOPY/ENDOMETRIAL HYSTEROSCOPIC RESECTION WITH SUBMUCOSAL FIBROIDS MYOSURE (N/A Uterus)     Patient location during evaluation: PACU Anesthesia Type: General Level of consciousness: awake and alert Pain management: pain level controlled Vital Signs Assessment: post-procedure vital signs reviewed and stable Respiratory status: spontaneous breathing, nonlabored ventilation, respiratory function stable and patient connected to nasal cannula oxygen Cardiovascular status: blood pressure returned to baseline and stable Postop Assessment: no apparent nausea or vomiting Anesthetic complications: no    Last Vitals:  Vitals:   07/16/17 1030 07/16/17 1045  BP: (!) 168/82 (!) 182/102  Pulse: 83 67  Resp: 16 11  Temp:    SpO2: (!) 88% 93%    Last Pain:  Vitals:   07/16/17 1025  TempSrc:   PainSc: 0-No pain                 Marquize Seib S

## 2017-07-16 NOTE — Transfer of Care (Signed)
Immediate Anesthesia Transfer of Care Note  Patient: Pamela Nguyen  Procedure(s) Performed: DILATATION & CURETTAGE/HYSTEROSCOPY/ENDOMETRIAL HYSTEROSCOPIC RESECTION WITH SUBMUCOSAL FIBROIDS MYOSURE (N/A Uterus)  Patient Location: PACU  Anesthesia Type:General  Level of Consciousness: sedated  Airway & Oxygen Therapy: Patient Spontanous Breathing and Patient connected to face mask oxygen  Post-op Assessment: Report given to RN and Post -op Vital signs reviewed and stable  Post vital signs: Reviewed and stable  Last Vitals:  Vitals Value Taken Time  BP    Temp    Pulse    Resp    SpO2      Last Pain:  Vitals:   07/16/17 0729  TempSrc:   PainSc: 1       Patients Stated Pain Goal: 5 (04/88/89 1694)  Complications: No apparent anesthesia complications

## 2017-07-16 NOTE — Anesthesia Preprocedure Evaluation (Signed)
Anesthesia Evaluation  Patient identified by MRN, date of birth, ID band Patient awake    Reviewed: Allergy & Precautions, NPO status , Patient's Chart, lab work & pertinent test results  Airway Mallampati: II  TM Distance: >3 FB Neck ROM: Full    Dental no notable dental hx.    Pulmonary neg pulmonary ROS,    Pulmonary exam normal breath sounds clear to auscultation       Cardiovascular negative cardio ROS Normal cardiovascular exam Rhythm:Regular Rate:Normal     Neuro/Psych negative neurological ROS  negative psych ROS   GI/Hepatic negative GI ROS, Neg liver ROS,   Endo/Other  negative endocrine ROS  Renal/GU negative Renal ROS  negative genitourinary   Musculoskeletal negative musculoskeletal ROS (+)   Abdominal   Peds negative pediatric ROS (+)  Hematology  (+) anemia ,   Anesthesia Other Findings   Reproductive/Obstetrics negative OB ROS                             Anesthesia Physical Anesthesia Plan  ASA: II  Anesthesia Plan: General   Post-op Pain Management:    Induction: Intravenous  PONV Risk Score and Plan: 3 and Ondansetron, Dexamethasone, Midazolam and Treatment may vary due to age or medical condition  Airway Management Planned: LMA  Additional Equipment:   Intra-op Plan:   Post-operative Plan: Extubation in OR  Informed Consent: I have reviewed the patients History and Physical, chart, labs and discussed the procedure including the risks, benefits and alternatives for the proposed anesthesia with the patient or authorized representative who has indicated his/her understanding and acceptance.   Dental advisory given  Plan Discussed with: CRNA and Surgeon  Anesthesia Plan Comments:         Anesthesia Quick Evaluation

## 2017-07-16 NOTE — Op Note (Signed)
OPERATIVE NOTE  Pamela Nguyen  DOB:    14-Jan-1966  MRN:    [id number]  CSN:    [id number]  Date of Surgery:  07/16/2017  Preoperative Diagnosis:  Menorrhagia  Submucosal fibroid  Anemia (hemoglobin 10.3)  BMI equals 30.7  Postoperative Diagnosis:  Same  Procedure:  Hysteroscopy  Dilatation and curettage  Resection of submucosal fibroid  Surgeon:  Gildardo Cranker, M.D.  Assistant:  None  Anesthetic:  General  Disposition:  The patient is a 52 y.o.-year-old female who presents with the above-stated diagnosis. She understands the indications for her surgical procedure. She accepts the risk of, but not limited to, anesthetic complications, bleeding, infections, and possible damage to the surrounding organs.   Findings:  On examination under anesthesia the uterus was 12 weeks size. No adnexal masses were appreciated. No parametrial disease was appreciated. The uterus sounded to 10.5 cm. The patient was noted to have 3 cm submucosal fibroid.  No other pathology was noted.  The patient was noted to be on her menstrual cycle and this made visualization very difficult.  We initially try to complete the procedure using the MyoSure.  This was unsuccessful because we were not able to properly visualize the fibroid.  The procedure was completed using the resectoscope.  The patient's blood pressure was noted to be elevated in the preoperative area.  Her blood pressure returned to normal once the operative procedure was started and adequate anesthesia was established.    Procedure:  The patient was taken to the operating room where a general anesthetic was given. The perineum and vagina were prepped with Betadine. The bladder was drained of urine using a Foley catheter. The patient was sterilely draped. Examination under anesthesia was performed. A paracervical block was placed using 10 cc of half percent Marcaine with epinephrine. An endocervical curettage was  performed. The cervix was gently dilated. The  hysteroscope was inserted and the cavity was carefully inspected. Pictures were taken. Findings included: A 3 cm submucosal fibroid.  Both tubal ostia were easily visualized.  I attempted to resect the submucosal fibroid using the MyoSure.  Visualization was inadequate.  The decision was made to switch to the mild sure because we had an outflow port.  The submucosal fibroid was resected although visualization was a challenge.  The cavity was then curetted using a sharp curet. The cavity was felt to be clean at the end of our procedure. Hemostasis was adequate. All instruments were removed. The examination was repeated and the uterus was noted to be firm. Sponge, and needle counts were correct. The estimated blood loss for the procedure was 10 cc. The estimated fluid deficit loss 1205 cc. The patient was awakened from her anesthetic without difficulty. She was returned to the supine position and and transported to the recovery room in stable condition. The endocervical curettings, endometrial resections, and endometrial curettings were sent to pathology.  Followup instructions:  The patient will return to see Dr. Raphael Gibney in 2 weeks. She was given a copy of the postoperative instructions for patients who've undergone hysteroscopy.  Discharge medications:  Motrin 800 mg every 8 hours as needed for mild to moderate pain. Oxycodone one tablet every 4 hours as needed for severe pain.   Gildardo Cranker, M.D.  July 16, 2017 10:20 AM

## 2017-07-16 NOTE — Discharge Instructions (Signed)
Hysteroscopy, Care After Refer to this sheet in the next few weeks. These instructions provide you with information on caring for yourself after your procedure. Your health care provider may also give you more specific instructions. Your treatment has been planned according to current medical practices, but problems sometimes occur. Call your health care provider if you have any problems or questions after your procedure. What can I expect after the procedure? After your procedure, it is typical to have the following:  You may have some cramping. This normally lasts for a couple days.  You may have bleeding. This can vary from light spotting for a few days to menstrual-like bleeding for 3-7 days.  Follow these instructions at home:  Rest for the first 1-2 days after the procedure.  Only take over-the-counter or prescription medicines as directed by your health care provider. Do not take aspirin. It can increase the chances of bleeding.  Take showers instead of baths for 2 weeks or as directed by your health care provider.  Do not drive for 24 hours or as directed.  Do not drink alcohol while taking pain medicine.  Do not use tampons, douche, or have sexual intercourse for 2 weeks or until your health care provider says it is okay.  Take your temperature twice a day for 4-5 days. Write it down each time.  Follow your health care provider's advice about diet, exercise, and lifting.  If you develop constipation, you may: ? Take a mild laxative if your health care provider approves. ? Add bran foods to your diet. ? Drink enough fluids to keep your urine clear or pale yellow.  Try to have someone with you or available to you for the first 24-48 hours, especially if you were given a general anesthetic.  Follow up with your health care provider as directed. Contact a health care provider if:  You feel dizzy or lightheaded.  You feel sick to your stomach (nauseous).  You have  abnormal vaginal discharge.  You have a rash.  You have pain that is not controlled with medicine. Get help right away if:  You have bleeding that is heavier than a normal menstrual period.  You have a fever.  You have increasing cramps or pain, not controlled with medicine.  You have new belly (abdominal) pain.  You pass out.  You have pain in the tops of your shoulders (shoulder strap areas).  You have shortness of breath. This information is not intended to replace advice given to you by your health care provider. Make sure you discuss any questions you have with your health care provider. Document Released: 12/30/2012 Document Revised: 08/17/2015 Document Reviewed: 10/08/2012 Elsevier Interactive Patient Education  2017 [facility] Instructions  Activity: Get plenty of rest for the remainder of the day. A responsible individual must stay with you for 24 hours following the procedure.  For the next 24 hours, DO NOT: -Drive a car -Paediatric nurse -Drink alcoholic beverages -Take any medication unless instructed by your physician -Make any legal decisions or sign important papers.  Meals: Start with liquid foods such as gelatin or soup. Progress to regular foods as tolerated. Avoid greasy, spicy, heavy foods. If nausea and/or vomiting occur, drink only clear liquids until the nausea and/or vomiting subsides. Call your physician if vomiting continues.  Special Instructions/Symptoms: Your throat may feel dry or sore from the anesthesia or the breathing tube placed in your throat during surgery. If this causes discomfort, gargle  with warm salt water. The discomfort should disappear within 24 hours. ° °If you had a scopolamine patch placed behind your ear for the management of post- operative nausea and/or vomiting: ° °1. The medication in the patch is effective for 72 hours, after which it should be removed.  Wrap patch in a tissue and discard in  the trash. Wash hands thoroughly with soap and water. °2. You may remove the patch earlier than 72 hours if you experience unpleasant side effects which may include dry mouth, dizziness or visual disturbances. °3. Avoid touching the patch. Wash your hands with soap and water after contact with the patch. °  ° ° °

## 2017-07-17 ENCOUNTER — Encounter (HOSPITAL_BASED_OUTPATIENT_CLINIC_OR_DEPARTMENT_OTHER): Payer: Self-pay | Admitting: Obstetrics and Gynecology

## 2020-08-24 ENCOUNTER — Emergency Department (HOSPITAL_COMMUNITY): Payer: 59

## 2020-08-24 ENCOUNTER — Emergency Department (HOSPITAL_COMMUNITY)
Admission: EM | Admit: 2020-08-24 | Discharge: 2020-08-25 | Disposition: A | Discharge status: Home / Self Care | Payer: 59 | Attending: Emergency Medicine | Admitting: Emergency Medicine

## 2020-08-24 ENCOUNTER — Encounter (HOSPITAL_COMMUNITY): Payer: Self-pay | Admitting: *Deleted

## 2020-08-24 ENCOUNTER — Other Ambulatory Visit: Payer: Self-pay

## 2020-08-24 DIAGNOSIS — R109 Unspecified abdominal pain: Secondary | ICD-10-CM | POA: Diagnosis present

## 2020-08-24 DIAGNOSIS — N200 Calculus of kidney: Secondary | ICD-10-CM

## 2020-08-24 DIAGNOSIS — N1330 Unspecified hydronephrosis: Secondary | ICD-10-CM | POA: Insufficient documentation

## 2020-08-24 DIAGNOSIS — R3915 Urgency of urination: Secondary | ICD-10-CM | POA: Insufficient documentation

## 2020-08-24 DIAGNOSIS — R519 Headache, unspecified: Secondary | ICD-10-CM | POA: Insufficient documentation

## 2020-08-24 DIAGNOSIS — N23 Unspecified renal colic: Secondary | ICD-10-CM

## 2020-08-24 LAB — COMPREHENSIVE METABOLIC PANEL
ALT: 9 U/L (ref 0–44)
AST: 15 U/L (ref 15–41)
Albumin: 4.4 g/dL (ref 3.5–5.0)
Alkaline Phosphatase: 78 U/L (ref 38–126)
Anion gap: 10 (ref 5–15)
BUN: 11 mg/dL (ref 6–20)
CO2: 23 mmol/L (ref 22–32)
Calcium: 9.6 mg/dL (ref 8.9–10.3)
Chloride: 108 mmol/L (ref 98–111)
Creatinine, Ser: 1.05 mg/dL — ABNORMAL HIGH (ref 0.44–1.00)
GFR, Estimated: >60 mL/min (ref >60)
Glucose, Bld: 135 mg/dL — ABNORMAL HIGH (ref 70–99)
Potassium: 3.2 mmol/L — ABNORMAL LOW (ref 3.5–5.1)
Sodium: 141 mmol/L (ref 135–145)
Total Bilirubin: 0.5 mg/dL (ref 0.3–1.2)
Total Protein: 7.9 g/dL (ref 6.5–8.1)

## 2020-08-24 LAB — I-STAT BETA HCG BLOOD, ED (MC, WL, AP ONLY): I-stat hCG, quantitative: 10.3 m[IU]/mL — ABNORMAL HIGH (ref <5)

## 2020-08-24 LAB — CBC
HCT: 35.4 % — ABNORMAL LOW (ref 36.0–46.0)
Hemoglobin: 9.9 g/dL — ABNORMAL LOW (ref 12.0–15.0)
MCH: 19.7 pg — ABNORMAL LOW (ref 26.0–34.0)
MCHC: 28 g/dL — ABNORMAL LOW (ref 30.0–36.0)
MCV: 70.5 fL — ABNORMAL LOW (ref 80.0–100.0)
Platelets: 340 10*3/uL (ref 150–400)
RBC: 5.02 MIL/uL (ref 3.87–5.11)
RDW: 20.3 % — ABNORMAL HIGH (ref 11.5–15.5)
WBC: 8.3 10*3/uL (ref 4.0–10.5)
nRBC: 0 % (ref 0.0–0.2)

## 2020-08-24 LAB — URINALYSIS, ROUTINE W REFLEX MICROSCOPIC
Bilirubin Urine: NEGATIVE
Glucose, UA: NEGATIVE mg/dL
Ketones, ur: NEGATIVE mg/dL
Leukocytes,Ua: NEGATIVE
Nitrite: NEGATIVE
Protein, ur: NEGATIVE mg/dL
Specific Gravity, Urine: 1.008 (ref 1.005–1.030)
pH: 7 (ref 5.0–8.0)

## 2020-08-24 LAB — LIPASE, BLOOD: Lipase: 25 U/L (ref 11–51)

## 2020-08-24 MED ORDER — FENTANYL CITRATE (PF) 100 MCG/2ML IJ SOLN
50.0000 ug | INTRAMUSCULAR | Status: DC | PRN
  Administered 2020-08-24: 50 ug via NASAL
  Filled 2020-08-24: qty 2

## 2020-08-24 MED ORDER — ONDANSETRON 4 MG PO TBDP
4.0000 mg | ORAL_TABLET | Freq: Once | ORAL | Status: AC | PRN
  Administered 2020-08-24: 4 mg via ORAL
  Filled 2020-08-24: qty 1

## 2020-08-24 NOTE — ED Provider Notes (Signed)
[facility] DEPT Provider Note   CSN: [id number] Arrival date & time: 08/24/20  2028     History Chief Complaint  Patient presents with  . Abdominal Pain    Left side (mid axillary line)    Pamela Nguyen is a 55 y.o. female.  HPI She presents for evaluation of left flank pain which started yesterday and has been associated with intermittent headache.  She also has urinary urgency today no prior history of kidney stones.  She has not seen any blood in her urine.  She denies constipation, nausea, vomiting, fever, chills, cough or shortness of breath.  There are no other known active modifying factors.    Past Medical History:  Diagnosis Date  . Chronic blood loss anemia    in epic ED visit 12-28-2016 syncope w/ collapse, Hg 4.0 , refused blood products and left AMA  . History of syncope 12/28/2016   w/ collapse--- ED visit dx severe anemia Hg 4.0,  pt refused blood transfusion and left AMA;  in epic documented prior Hg 6.8 after miscarriage 02-20-2010  . Menorrhagia   . MVP (mitral valve prolapse)    07-07-2017  per pt dx w/ MVP at age 1, had echo was told very mild, pt asymptomatic--- no further has been done  . Uterine fibroid     Patient Active Problem List   Diagnosis Date Noted  . Weight gain 08/26/2011  . Hair loss 08/26/2011  . Menorrhagia 08/26/2011  . Anemia 08/26/2011    Past Surgical History:  Procedure Laterality Date  . CESAREAN SECTION  04/01/1986  . DILATATION & CURRETTAGE/HYSTEROSCOPY WITH RESECTOCOPE N/A 07/16/2017   Procedure: DILATATION & CURETTAGE/HYSTEROSCOPY/ENDOMETRIAL HYSTEROSCOPIC RESECTION WITH SUBMUCOSAL FIBROIDS MYOSURE;  Surgeon: Ena Dawley, MD;  Location: [facility];  Service: Gynecology;  Laterality: N/A;  . DILATION AND CURETTAGE OF UTERUS  02-20-2010  dr Raphael Gibney   w/ sunction  for missed ab  . WISDOM TOOTH EXTRACTION       OB History    Gravida  3   Para  2   Term       Preterm      AB  1   Living  2     SAB  1   IAB      Ectopic      Multiple      Live Births              Family History  Problem Relation Age of Onset  . Hypertension Mother   . Breast cancer Maternal Aunt   . Diabetes Maternal Grandmother   . Hypertension Maternal Grandmother   . Diabetes Maternal Grandfather   . Hypertension Maternal Grandfather   . Heart disease Maternal Grandfather     Social History   Tobacco Use  . Smoking status: Never Smoker  . Smokeless tobacco: Never Used  Vaping Use  . Vaping Use: Never used  Substance Use Topics  . Alcohol use: Not Currently  . Drug use: No    Home Medications Prior to Admission medications   Medication Sig Start Date End Date Taking? Authorizing Provider  Ascorbic Acid (VITAMIN C) 1000 MG tablet Take 1,000 mg by mouth daily.    [provider]  ferrous sulfate 325 (65 FE) MG EC tablet Take 325 mg by mouth daily.    [provider]  ibuprofen (ADVIL,MOTRIN) 800 MG tablet Take 1 tablet (800 mg total) by mouth every 8 (eight) hours as needed. 07/16/17  Ena Dawley, MD  OVER THE COUNTER MEDICATION 4 (four) times daily. Tissue cell salt    [provider]  oxyCODONE-acetaminophen (PERCOCET/ROXICET) 5-325 MG tablet Take 1 tablet by mouth every 4 (four) hours as needed for severe pain. The patient states that she is able to take this medication as a pain pill, but codeine as a cough syrup makes her cough more. 07/16/17   Ena Dawley, MD    Allergies    Codeine  Review of Systems   Review of Systems  All other systems reviewed and are negative.   Physical Exam Updated Vital Signs BP (!) 214/103   Pulse 69   Temp 98.1 F (36.7 C) (Oral)   Resp 16   Wt 90.7 kg   LMP 04/26/2020 (Approximate)   SpO2 100%   BMI 31.32 kg/m   Physical Exam Vitals and nursing note reviewed.  Constitutional:      General: She is not in acute distress.    Appearance: She is well-developed.  She is not ill-appearing, toxic-appearing or diaphoretic.  HENT:     Head: Normocephalic and atraumatic.     Right Ear: External ear normal.     Left Ear: External ear normal.  Eyes:     Conjunctiva/sclera: Conjunctivae normal.     Pupils: Pupils are equal, round, and reactive to light.  Neck:     Trachea: Phonation normal.  Cardiovascular:     Rate and Rhythm: Normal rate.  Pulmonary:     Effort: Pulmonary effort is normal.  Abdominal:     General: There is no distension.     Palpations: Abdomen is soft.     Tenderness: There is no abdominal tenderness.  Musculoskeletal:        General: Normal range of motion.     Cervical back: Normal range of motion and neck supple.  Skin:    General: Skin is warm and dry.  Neurological:     Mental Status: She is alert and oriented to person, place, and time.     Cranial Nerves: No cranial nerve deficit.     Sensory: No sensory deficit.     Motor: No abnormal muscle tone.     Coordination: Coordination normal.  Psychiatric:        Mood and Affect: Mood normal.        Behavior: Behavior normal.        Thought Content: Thought content normal.        Judgment: Judgment normal.     ED Results / Procedures / Treatments   Labs (all labs ordered are listed, but only abnormal results are displayed) Labs Reviewed  COMPREHENSIVE METABOLIC PANEL - Abnormal; Notable for the following components:      Result Value   Potassium 3.2 (*)    Glucose, Bld 135 (*)    Creatinine, Ser 1.05 (*)    All other components within normal limits  CBC - Abnormal; Notable for the following components:   Hemoglobin 9.9 (*)    HCT 35.4 (*)    MCV 70.5 (*)    MCH 19.7 (*)    MCHC 28.0 (*)    RDW 20.3 (*)    All other components within normal limits  I-STAT BETA HCG BLOOD, ED ([facility], [facility], [facility] ONLY) - Abnormal; Notable for the following components:   I-stat hCG, quantitative 10.3 (*)    All other components within normal limits  LIPASE, BLOOD  URINALYSIS, ROUTINE  W REFLEX MICROSCOPIC    EKG None  Radiology No results found.  Procedures Procedures   Medications Ordered in ED Medications  fentaNYL (SUBLIMAZE) injection 50 mcg (50 mcg Nasal Given 08/24/20 2053)  ondansetron (ZOFRAN-ODT) disintegrating tablet 4 mg (4 mg Oral Given 08/24/20 2052)    ED Course  I have reviewed the triage vital signs and the nursing notes.  Pertinent labs & imaging results that were available during my care of the patient were reviewed by me and considered in my medical decision making (see chart for details).    MDM Rules/Calculators/A&P                           Patient Vitals for the past 24 hrs:  BP Temp Temp src Pulse Resp SpO2 Weight  08/24/20 2115 (!) 214/103 -- -- 69 -- 100 % --  08/24/20 2043 138/86 98.1 F (36.7 C) Oral 78 16 100 % 90.7 kg   11:35 PM-  reevaluation with update and discussion. After initial assessment and treatment, an updated evaluation reveals no flank pain at this time.  No recurrence of pain since it resolved earlier. Daleen Bo   Medical Decision Making:  This patient is presenting for evaluation of left flank pain, which does require a range of treatment options, and is a complaint that involves a moderate risk of morbidity and mortality. The differential diagnoses include UTI, kidney stone,Muscle strain. I decided to review old records, and in summary healthy female presenting with nonspecific pain.  I did not require additional historical information from anyone.  Clinical Laboratory Tests Ordered, included CBC, Metabolic panel and Urinalysis. Review indicates Normal except hemoglobin low, MCV low, potassium low, glucose high, creatinine high, blood in urine. Radiologic Tests Ordered, included CT abdomen pelvis.  I independently Visualized: Radiograph images, which show punctate nonobstructing stones, right kidney.  Hydronephrosis left, no ureteral stone left.  No other intra-abdominal complications.    Critical  Interventions-clinical evaluation, laboratory testing, CT imaging, observation reassessment  After These Interventions, the Patient was reevaluated and was found comfortable ready to go home.  Instructed follow-up with urology, for further care and treatment.  Informed patient to see PCP about anemia, and hypokalemia.  CRITICAL CARE-no Performed by: Daleen Bo  Nursing Notes Reviewed/ Care Coordinated Applicable Imaging Reviewed Interpretation of Laboratory Data incorporated into ED treatment  The patient appears reasonably screened and/or stabilized for discharge and I doubt any other medical condition or other [facility] requiring further screening, evaluation, or treatment in the ED at this time prior to discharge.  Plan: Home Medications-continue usual medication, use Tylenol or Motrin for pain; Home Treatments-rates advance diet and activity; return here if the recommended treatment, does not improve the symptoms; Recommended follow up-neurology and PCP follow-up next week.     Final Clinical Impression(s) / ED Diagnoses Final diagnoses:  None    Rx / DC Orders ED Discharge Orders    None       Daleen Bo, MD 08/24/20 2356

## 2020-08-24 NOTE — ED Provider Notes (Signed)
Emergency Medicine Provider Triage Evaluation Note  Pamela Nguyen , a 55 y.o. female  was evaluated in triage.  Pt complains of abd pain.  Review of Systems  Positive: abd pain, diaphroretic, n/v Negative: Fever, dysuria  Physical Exam  BP 138/86 (BP Location: Right Arm)   Pulse 78   Temp 98.1 F (36.7 C) (Oral)   Resp 16   Wt 90.7 kg   LMP 04/26/2020 (Approximate)   SpO2 100%   BMI 31.32 kg/m  Gen:   Awake, appears uncomfortable Resp:  Normal effort  MSK:   Moves extremities without difficulty  Other:  ttp LLQ  Medical Decision Making  Medically screening exam initiated at 8:54 PM.  Appropriate orders placed.  Pamela Nguyen was informed that the remainder of the evaluation will be completed by another provider, this initial triage assessment does not replace that evaluation, and the importance of remaining in the ED until their evaluation is complete.  Pain to LLQ started yesterday but much more intense today.  Break out into a sweat, nauseous, vomiting.     Domenic Moras, PA-C 08/24/20 2057    Breck Coons, MD 08/24/20 [phone]

## 2020-08-24 NOTE — Discharge Instructions (Addendum)
Use Tylenol or Motrin for pain.  Call the urologist for follow-up appointment to discuss kidney stone disease.  The testing today also showed your hemoglobin is low and potassium slightly low.  Follow-up with your primary care doctor for evaluation treatment of these problems.

## 2020-08-24 NOTE — ED Triage Notes (Signed)
Pt is here for pain in left side (mid axillary line) with radiation to LLQ.  Pt felt just a "tinge of pain" yesterday and 2 hours ago the pain became severe and is increasing.  Pt is in obviously severe pain and diaphoretic and vomited x1 in waiting area (undigested food).

## 2022-01-18 IMAGING — CT CT RENAL STONE PROTOCOL
2 of 4 series · 16 of 46 positions shown, 18 images · non-contrast
Comparison: None.

CLINICAL DATA: Left lower quadrant pain

EXAM:
CT ABDOMEN AND PELVIS WITHOUT CONTRAST
TECHNIQUE: Multidetector CT imaging of the abdomen and pelvis was performed
following the standard protocol without IV contrast.

[Series 2: axial st · axial · 0.78mm/px · z∈[+1200,+1650]mm · 13 of 102 slices shown, 15 images]
[im 6/102  soft-tissue]
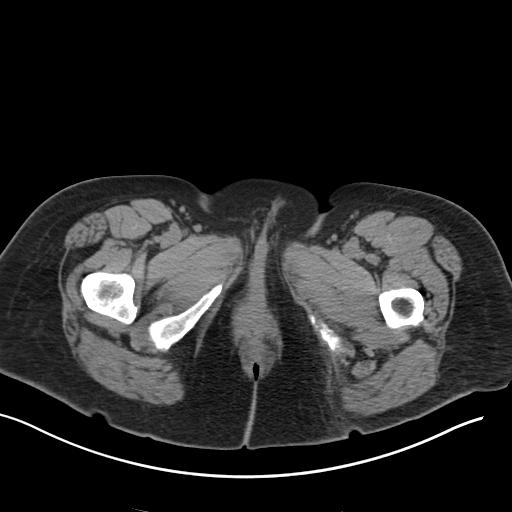
[im 6/102  bone]
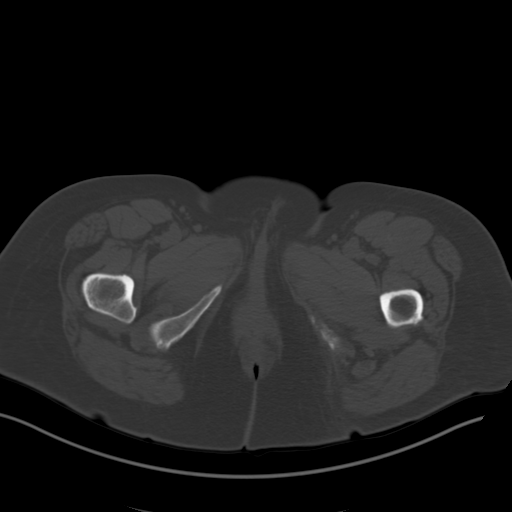
[im 12/102  soft-tissue]
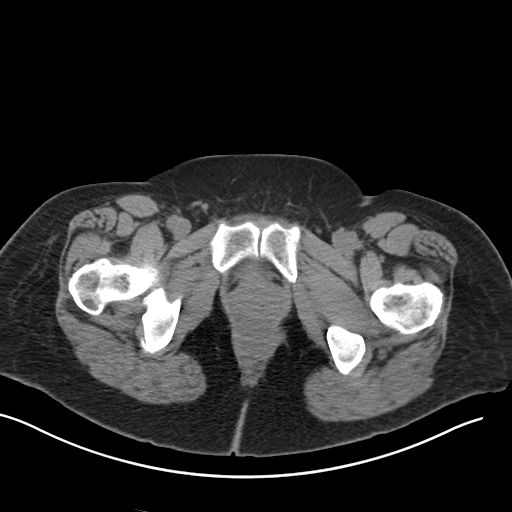
[im 23/102  soft-tissue]
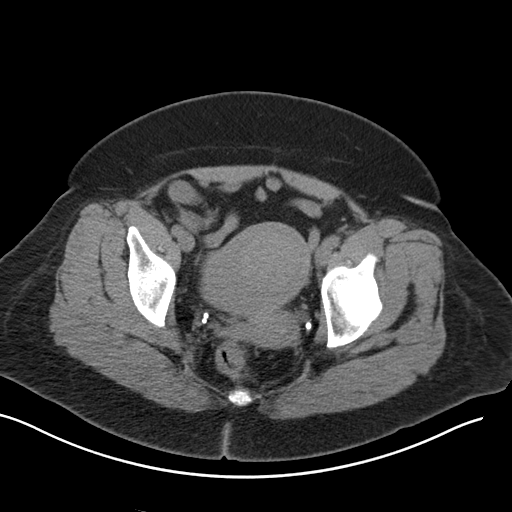
[im 29/102  soft-tissue]
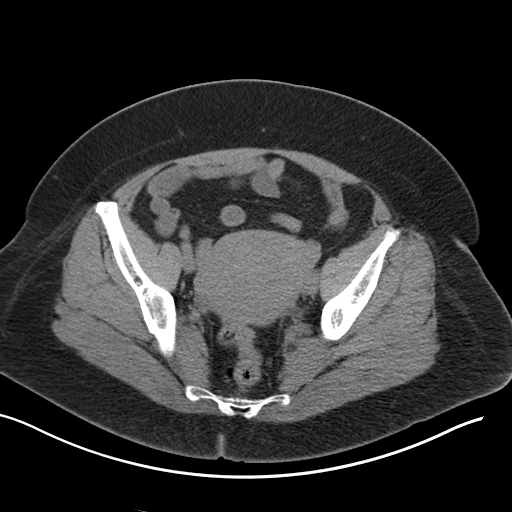
[im 34/102  soft-tissue]
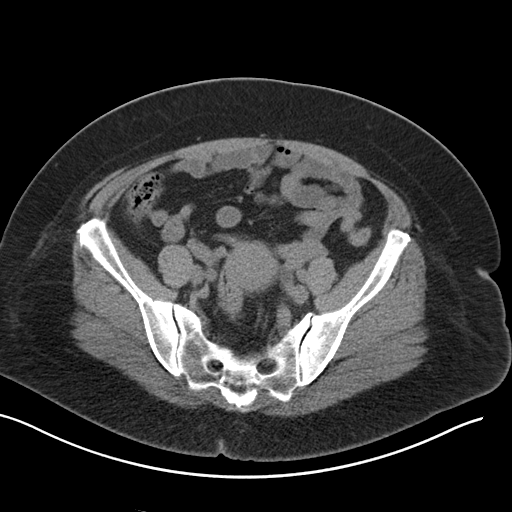
[im 45/102  soft-tissue]
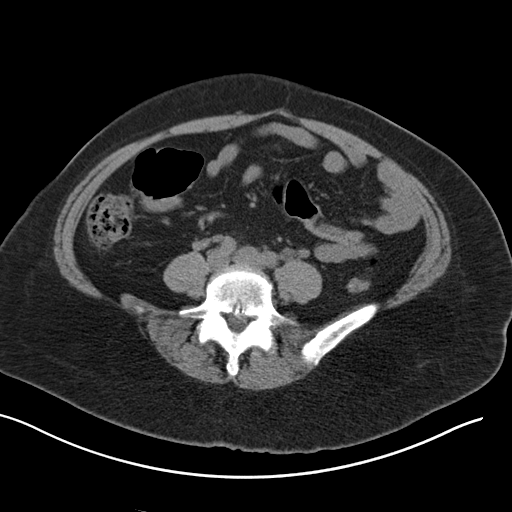
[im 51/102  soft-tissue]
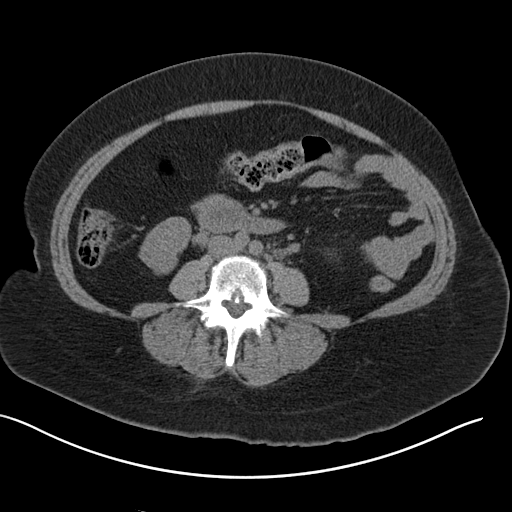
[im 57/102  soft-tissue]
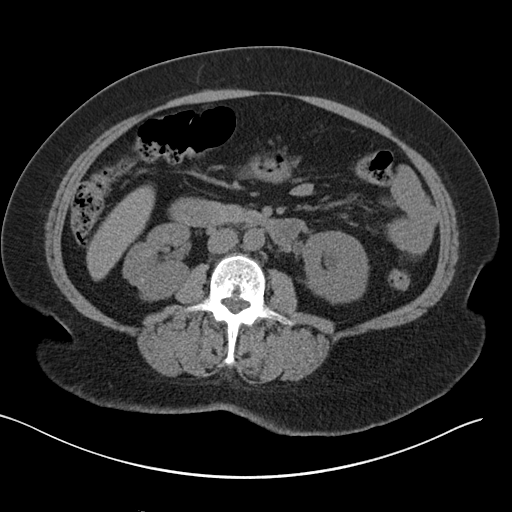
[im 68/102  soft-tissue]
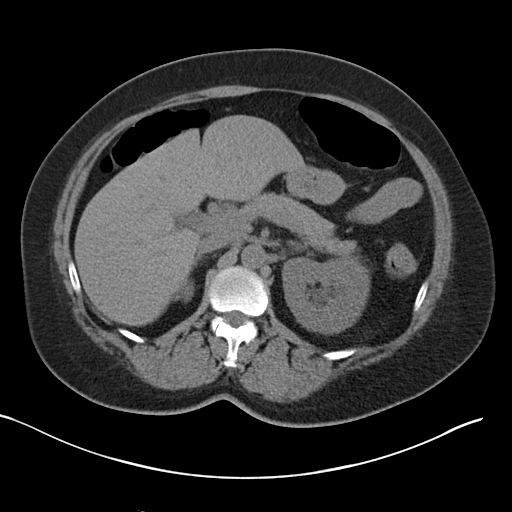
[im 68/102  bone]
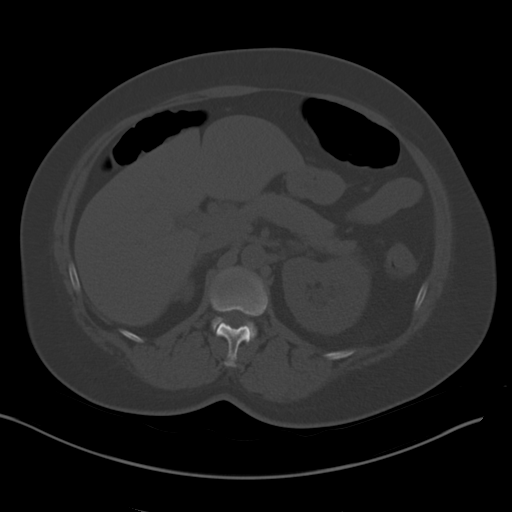
[im 73/102  soft-tissue]
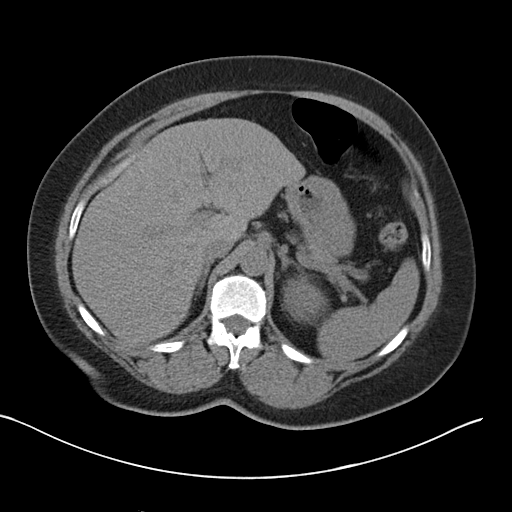
[im 79/102  soft-tissue]
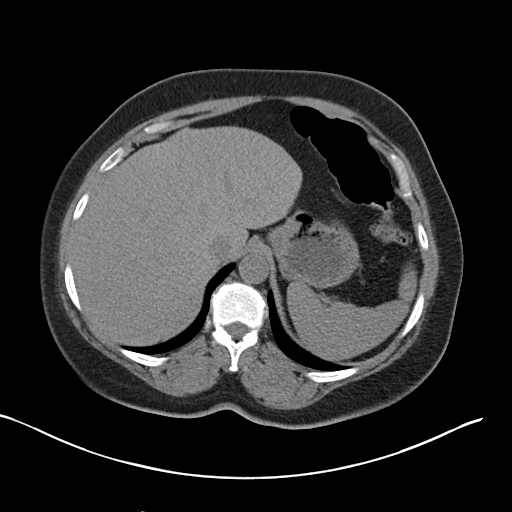
[im 90/102  soft-tissue]
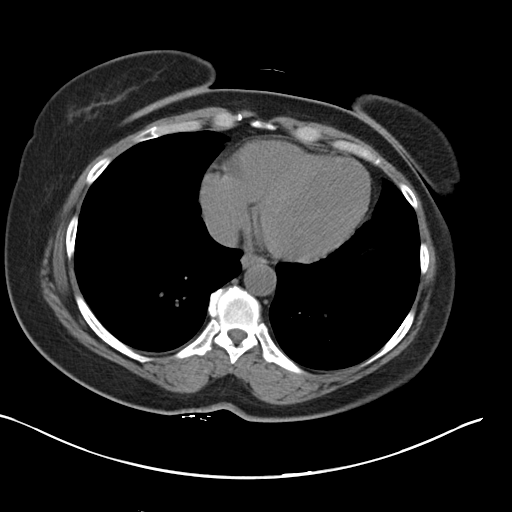
[im 96/102  soft-tissue]
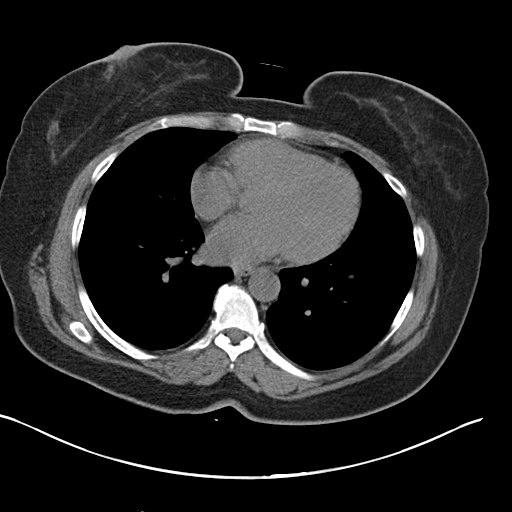

[Series 5: coronal · coronal · 0.79mm/px · 3 of 158 slices shown]
[im 53/158  soft-tissue]
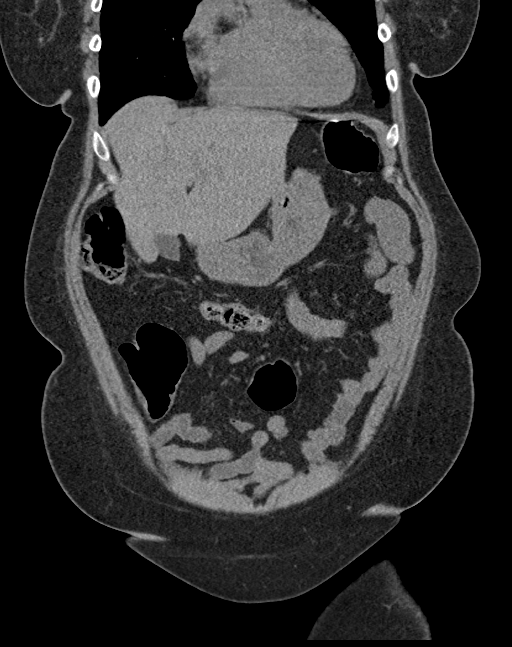
[im 70/158  soft-tissue]
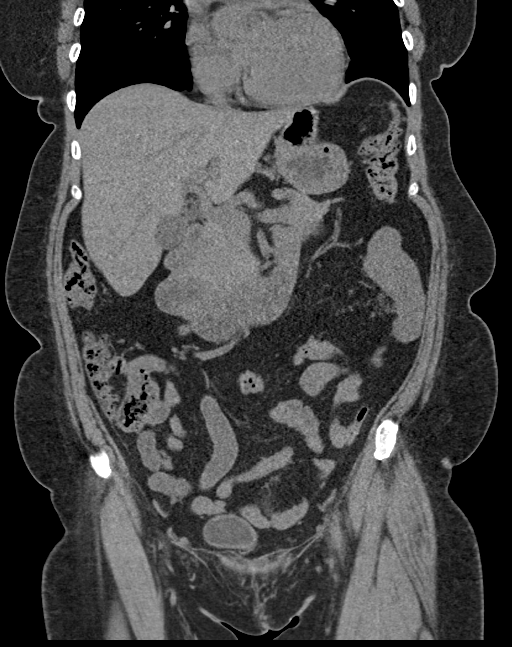
[im 88/158  soft-tissue]
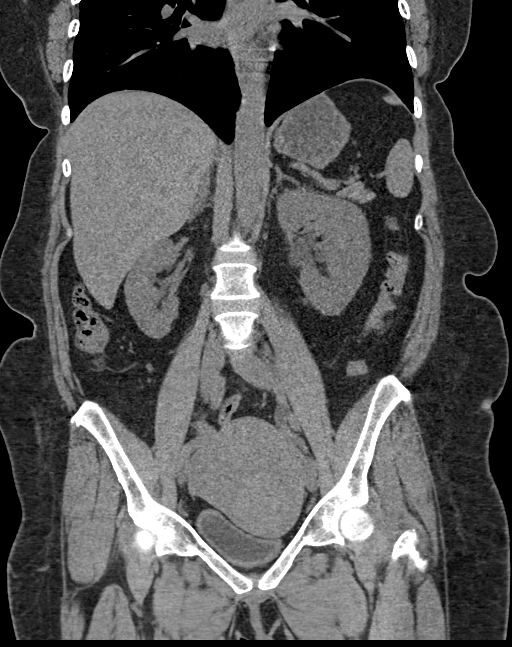

[16 of 46 positions shown; findings below may reference images not displayed]

FINDINGS: Lower chest: Lung bases are clear. No effusions. Heart is normal
size.

Hepatobiliary: No focal hepatic abnormality. Gallbladder
unremarkable.

Pancreas: No focal abnormality or ductal dilatation.

Spleen: No focal abnormality.  Normal size.

Adrenals/Urinary Tract: Several small punctate nonobstructing left
renal stones. Mild left hydronephrosis. No ureteral stones. Adrenal
glands unremarkable. Areas of scarring in the right kidney. Urinary
bladder unremarkable.

Stomach/Bowel: Stomach, large and small bowel grossly unremarkable.
Appendix normal.

Vascular/Lymphatic: No evidence of aneurysm or adenopathy.

Reproductive: Uterus and adnexa unremarkable.  No mass.

Other: No free fluid or free air.

Musculoskeletal: No acute bony abnormality.
IMPRESSION: Mild left hydronephrosis.  No visible ureteral stones.

Several small punctate right renal stones. Areas of right renal
scarring and cortical loss.

Normal appendix.
